# Patient Record
Sex: Male | Born: 1990 | Race: Black or African American | Hispanic: No | Marital: Single | Smoking: Current every day smoker
Health system: Southern US, Community
[De-identification: ages and names within clinical notes are randomized; demographics above are authoritative.]

---
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
## 2011-11-11 ENCOUNTER — Emergency Department (INDEPENDENT_AMBULATORY_CARE_PROVIDER_SITE_OTHER): Payer: Self-pay

## 2011-11-11 ENCOUNTER — Emergency Department (HOSPITAL_BASED_OUTPATIENT_CLINIC_OR_DEPARTMENT_OTHER)
Admission: EM | Admit: 2011-11-11 | Discharge: 2011-11-11 | Disposition: A | Discharge status: Home / Self Care | Payer: Self-pay | Attending: Emergency Medicine | Admitting: Emergency Medicine

## 2011-11-11 ENCOUNTER — Other Ambulatory Visit: Payer: Self-pay

## 2011-11-11 ENCOUNTER — Encounter: Payer: Self-pay | Admitting: *Deleted

## 2011-11-11 DIAGNOSIS — J45909 Unspecified asthma, uncomplicated: Secondary | ICD-10-CM | POA: Insufficient documentation

## 2011-11-11 DIAGNOSIS — R0989 Other specified symptoms and signs involving the circulatory and respiratory systems: Secondary | ICD-10-CM

## 2011-11-11 DIAGNOSIS — R0602 Shortness of breath: Secondary | ICD-10-CM

## 2011-11-11 DIAGNOSIS — R0789 Other chest pain: Secondary | ICD-10-CM | POA: Insufficient documentation

## 2011-11-11 NOTE — ED Notes (Signed)
Patient states that around 11am today he started experiencing SOB, L side stabbing chest pain, that  were intermitent, given one nitro and four baby aspirin during transport, no cardiac Hx

## 2011-11-11 NOTE — ED Provider Notes (Signed)
History     CSN: [id number] Arrival date & time: 11/11/2011 11:52 AM   First MD Initiated Contact with Patient 11/11/11 1230      Chief Complaint  Patient presents with  . Chest Pain    (Consider location/radiation/quality/duration/timing/severity/associated sxs/prior treatment) HPI Comments: Patient was at home, started with sudden onset of severe, sharp pain in the left anterior aspect of the chest.  He was not sob, coughing.  No other symptoms.  He is other wise healthy, exercises daily and has never had anything like this before.  Patient is a 20 y.o. male presenting with chest pain. The history is provided by the patient.  Chest Pain The chest pain began 1 - 2 hours ago. Chest pain occurs constantly. The chest pain is resolved. Associated with: nothing. At its most intense, the pain is at 10/10. The pain is currently at 0/10. The severity of the pain is severe. The quality of the pain is described as sharp. The pain does not radiate. Pertinent negatives for primary symptoms include no fever, no cough, no wheezing and no palpitations. He tried nothing for the symptoms. Risk factors include no known risk factors.     Past Medical History  Diagnosis Date  . Asthma     History reviewed. No pertinent past surgical history.  No family history on file.  History  Substance Use Topics  . Smoking status: Current Everyday Smoker  . Smokeless tobacco: Not on file  . Alcohol Use: No      Review of Systems  Constitutional: Negative for fever.  Respiratory: Negative for cough and wheezing.   Cardiovascular: Positive for chest pain. Negative for palpitations.  All other systems reviewed and are negative.    Allergies  Review of patient's allergies indicates no known allergies.  Home Medications   Current Outpatient Rx  Name Route Sig Dispense Refill  . ALBUTEROL IN Inhalation Inhale into the lungs as needed.        BP 110/52  Pulse 66  Temp 98.4 F (36.9 C)  Resp  19  SpO2 96%  Physical Exam  Nursing note and vitals reviewed. Constitutional: He is oriented to person, place, and time. He appears well-developed and well-nourished.  HENT:  Head: Normocephalic and atraumatic.  Neck: Normal range of motion. Neck supple.  Cardiovascular: Normal rate and regular rhythm.  Exam reveals no gallop and no friction rub.   No murmur heard. Pulmonary/Chest: Effort normal and breath sounds normal. No respiratory distress. He has no wheezes.  Abdominal: Soft. Bowel sounds are normal. He exhibits no distension. There is no tenderness.  Musculoskeletal: Normal range of motion. He exhibits no edema.  Neurological: He is alert and oriented to person, place, and time.  Skin: Skin is warm and dry.    ED Course  Procedures (including critical care time)  Labs Reviewed - No data to display No results found.   No diagnosis found.   Date: 11/11/2011  Rate: 64  Rhythm: normal sinus rhythm  QRS Axis: normal  Intervals: normal  ST/T Wave abnormalities: normal  Conduction Disutrbances:none  Narrative Interpretation:   Old EKG Reviewed: none available    MDM  Chest xray and ekg are both normal.  I suspect some sort of musculoskeletal etiology for the patient's symptoms which have since resolved.  I believe that patient is stable for discharge.  To return for any problems.          Geoffery Lyons, MD 11/11/11 1400

## 2011-11-11 NOTE — ED Notes (Signed)
Patient placed on 2 L oxygen  [place] per protocol

## 2011-11-11 NOTE — ED Notes (Signed)
Patient placed on five lead for monitoring, no c/o chest pain at this time, c/o HA

## 2012-12-19 ENCOUNTER — Emergency Department (HOSPITAL_COMMUNITY)
Admission: EM | Admit: 2012-12-19 | Discharge: 2012-12-19 | Disposition: A | Discharge status: Home / Self Care | Payer: Self-pay | Attending: Emergency Medicine | Admitting: Emergency Medicine

## 2012-12-19 ENCOUNTER — Encounter (HOSPITAL_COMMUNITY): Payer: Self-pay | Admitting: *Deleted

## 2012-12-19 DIAGNOSIS — L259 Unspecified contact dermatitis, unspecified cause: Secondary | ICD-10-CM | POA: Insufficient documentation

## 2012-12-19 DIAGNOSIS — F17200 Nicotine dependence, unspecified, uncomplicated: Secondary | ICD-10-CM | POA: Insufficient documentation

## 2012-12-19 DIAGNOSIS — Z79899 Other long term (current) drug therapy: Secondary | ICD-10-CM | POA: Insufficient documentation

## 2012-12-19 DIAGNOSIS — J45909 Unspecified asthma, uncomplicated: Secondary | ICD-10-CM | POA: Insufficient documentation

## 2012-12-19 MED ORDER — HYDROCORTISONE 1 % EX CREA: TOPICAL_CREAM | Freq: Two times a day (BID) | CUTANEOUS | Status: DC

## 2012-12-19 NOTE — ED Provider Notes (Signed)
History  This chart was scribed for non-physician practitioner working with Celene Kras, MD by Ardeen Jourdain, ED Scribe. This patient was seen in room TR05C/TR05C and the patient's care was started at 1545. .  CSN: [id number]  Arrival date & time 12/19/12  1521   First MD Initiated Contact with Patient 12/19/12 1545      Chief Complaint  Patient presents with  . Rash     The history is provided by the patient. No language interpreter was used.    Brad Wiggins is a 22 y.o. male who presents to the Emergency Department complaining of a rash to his left arm that has spread to his left abdomen with associated itching. He states the rash began this morning. He denies any pain with the rash, fever, nausea, emesis and diarrhea as associated symptoms. He admits to possible contact with a new soap but no other new substances or places that he has come into contact with.    Past Medical History  Diagnosis Date  . Asthma     History reviewed. No pertinent past surgical history.  History reviewed. No pertinent family history.  History  Substance Use Topics  . Smoking status: Current Every Day Smoker  . Smokeless tobacco: Not on file  . Alcohol Use: No      Review of Systems  Constitutional: Negative for fever and chills.  Gastrointestinal: Negative for nausea, vomiting and diarrhea.  Skin: Positive for rash.  All other systems reviewed and are negative.    Allergies  Review of patient's allergies indicates no known allergies.  Home Medications   Current Outpatient Rx  Name  Route  Sig  Dispense  Refill  . ALBUTEROL IN   Inhalation   Inhale into the lungs as needed.             Triage Vitals: BP 115/62  Pulse 62  Temp 98.3 F (36.8 C) (Oral)  Resp 16  SpO2 98%  Physical Exam  Nursing note and vitals reviewed. Constitutional: He is oriented to person, place, and time. He appears well-developed and well-nourished. No distress.  HENT:  Head: Normocephalic  and atraumatic.  Eyes: EOM are normal. Pupils are equal, round, and reactive to light.  Neck: Normal range of motion. Neck supple. No tracheal deviation present.  Cardiovascular: Normal rate.   Pulmonary/Chest: Effort normal. No respiratory distress.  Abdominal: Soft. He exhibits no distension.  Musculoskeletal: Normal range of motion. He exhibits no edema.  Neurological: He is alert and oriented to person, place, and time.  Skin: Skin is warm and dry. Rash noted.       3 by 2 cm papule on left lower abdomen, several small 1 by 1 cm papules on upper left arm   Psychiatric: He has a normal mood and affect. His behavior is normal.    ED Course  Procedures (including critical care time)  DIAGNOSTIC STUDIES: Oxygen Saturation is 98% on room air, normal by my interpretation.    COORDINATION OF CARE:  3:46 PM: Discussed treatment plan which includes steroid cream and recommendation with pt at bedside and pt agreed to plan.    Labs Reviewed - No data to display No results found.   1. Contact dermatitis       MDM  Will treat with hydrocortisone. Patient is stable and ready for discharge. I personally performed the services described in this documentation, which was scribed in my presence. The recorded information has been reviewed and is accurate.  Roxy Horseman, PA-C 12/20/12 [phone]

## 2012-12-19 NOTE — ED Notes (Addendum)
Reports recently changing to a different soap. Several raised, red areas on left upper arm. Also reports he has similar places on his truck and back. C/o itching, denies other symptoms.

## 2012-12-19 NOTE — ED Notes (Signed)
Pt reports having rash that started last night left arm, has spread to trunk area left side, reports itching.

## 2012-12-20 NOTE — ED Provider Notes (Signed)
Medical screening examination/treatment/procedure(s) were performed by non-physician practitioner and as supervising physician I was immediately available for consultation/collaboration.    Secilia Apps R Carisa Backhaus, MD 12/20/12 0031 

## 2012-12-25 ENCOUNTER — Emergency Department (HOSPITAL_COMMUNITY)
Admission: EM | Admit: 2012-12-25 | Discharge: 2012-12-25 | Disposition: A | Discharge status: Home / Self Care | Payer: Self-pay | Attending: Emergency Medicine | Admitting: Emergency Medicine

## 2012-12-25 ENCOUNTER — Encounter (HOSPITAL_COMMUNITY): Payer: Self-pay | Admitting: Cardiology

## 2012-12-25 DIAGNOSIS — L239 Allergic contact dermatitis, unspecified cause: Secondary | ICD-10-CM

## 2012-12-25 DIAGNOSIS — L259 Unspecified contact dermatitis, unspecified cause: Secondary | ICD-10-CM | POA: Insufficient documentation

## 2012-12-25 DIAGNOSIS — F17200 Nicotine dependence, unspecified, uncomplicated: Secondary | ICD-10-CM | POA: Insufficient documentation

## 2012-12-25 DIAGNOSIS — J45909 Unspecified asthma, uncomplicated: Secondary | ICD-10-CM | POA: Insufficient documentation

## 2012-12-25 MED ORDER — DIPHENHYDRAMINE HCL 25 MG PO TABS: 25.0000 mg | ORAL_TABLET | Freq: Four times a day (QID) | ORAL | Status: DC

## 2012-12-25 MED ORDER — TRIAMCINOLONE ACETONIDE 0.1 % EX CREA: TOPICAL_CREAM | Freq: Two times a day (BID) | CUTANEOUS | Status: DC

## 2012-12-25 NOTE — ED Provider Notes (Signed)
History  This chart was scribed for non-physician practitioner working with Carleene Cooper III, MD by Ardeen Jourdain, ED Scribe. This patient was seen in room TR05C/TR05C and the patient's care was started at 1656.  CSN: [id number]  Arrival date & time 12/25/12  1633   First MD Initiated Contact with Patient 12/25/12 1656      Chief Complaint  Patient presents with  . Rash     The history is provided by the patient. No language interpreter was used.    Brad Wiggins is a 22 y.o. male who presents to the Emergency Department complaining of a rash to his chest that is spreading to his right side with associated itching. He state he was seen last week for a similar rash on his left arm that was relieved by the hydrocortisone cream prescribed. He states the cream has not relieved the current rash. He denies coming into contact with any new substances or foods. He denies any pain to the area, difficulty breathing, difficulty swallowing, fever and chills as associated symptoms.    Past Medical History  Diagnosis Date  . Asthma     History reviewed. No pertinent past surgical history.  History reviewed. No pertinent family history.  History  Substance Use Topics  . Smoking status: Current Every Day Smoker  . Smokeless tobacco: Not on file  . Alcohol Use: No      Review of Systems  Constitutional: Negative for fever and chills.  Respiratory: Negative for chest tightness, shortness of breath and wheezing.   Gastrointestinal: Negative for nausea and vomiting.  Skin: Positive for rash.    Allergies  Review of patient's allergies indicates no known allergies.  Home Medications   Current Outpatient Rx  Name  Route  Sig  Dispense  Refill  . HYDROCORTISONE 1 % EX CREA   Topical   Apply topically 2 (two) times daily.   30 g   2     Triage Vitals: BP 98/69  Pulse 59  Temp 98.1 F (36.7 C) (Oral)  Resp 18  SpO2 99%  Physical Exam  Nursing note and vitals  reviewed. Constitutional: He is oriented to person, place, and time. He appears well-developed and well-nourished. No distress.  HENT:  Head: Normocephalic and atraumatic.       No oral lesions   Eyes: EOM are normal. Pupils are equal, round, and reactive to light.  Neck: Normal range of motion. Neck supple. No tracheal deviation present.  Cardiovascular: Normal rate, regular rhythm and normal heart sounds.   Pulmonary/Chest: Effort normal and breath sounds normal. No respiratory distress.  Abdominal: Soft. Bowel sounds are normal. He exhibits no distension. There is no tenderness.  Musculoskeletal: Normal range of motion. He exhibits no edema.  Neurological: He is alert and oriented to person, place, and time.  Skin: Skin is warm and dry. Rash noted.       Urticarious rash to left pectoral that is spreading to the right side of his chest, non-tender, skins intact, not warm to touch  Psychiatric: He has a normal mood and affect. His behavior is normal.    ED Course  Procedures (including critical care time)  DIAGNOSTIC STUDIES: Oxygen Saturation is 99% on room air, normal by my interpretation.    COORDINATION OF CARE:  5:00 PM: Discussed treatment plan which includes a steroid cream with pt at bedside and pt agreed to plan.     Labs Reviewed - No data to display No results found.  1. Allergic dermatitis       MDM  22 y/o male with hives/allergic dermatitis. Rx triamcinolone cream and benadryl. Advised cool compresses. No respiratory or airway compromise. Return precautions discussed. Patient states understanding of plan and is agreeable.     I personally performed the services described in this documentation, which was scribed in my presence. The recorded information has been reviewed and is accurate.    Trevor Mace, PA-C 12/25/12 [phone]

## 2012-12-25 NOTE — ED Notes (Signed)
Pt reports he was seen here previously with a rash on his arm. States the area has cleared up but is now having a rash on his neck. Pt states the area is itching.

## 2012-12-26 NOTE — ED Provider Notes (Signed)
Medical screening examination/treatment/procedure(s) were performed by non-physician practitioner and as supervising physician I was immediately available for consultation/collaboration.   Carleene Cooper III, MD 12/26/12 [phone]

## 2013-09-23 ENCOUNTER — Encounter (HOSPITAL_COMMUNITY): Payer: Self-pay | Admitting: Emergency Medicine

## 2013-09-23 ENCOUNTER — Emergency Department (HOSPITAL_COMMUNITY)
Admission: EM | Admit: 2013-09-23 | Discharge: 2013-09-23 | Disposition: A | Discharge status: Home / Self Care | Payer: Self-pay | Attending: Emergency Medicine | Admitting: Emergency Medicine

## 2013-09-23 ENCOUNTER — Emergency Department (HOSPITAL_COMMUNITY): Payer: Self-pay

## 2013-09-23 DIAGNOSIS — N453 Epididymo-orchitis: Secondary | ICD-10-CM | POA: Insufficient documentation

## 2013-09-23 DIAGNOSIS — J45909 Unspecified asthma, uncomplicated: Secondary | ICD-10-CM | POA: Insufficient documentation

## 2013-09-23 DIAGNOSIS — Z792 Long term (current) use of antibiotics: Secondary | ICD-10-CM | POA: Insufficient documentation

## 2013-09-23 DIAGNOSIS — N451 Epididymitis: Secondary | ICD-10-CM

## 2013-09-23 DIAGNOSIS — F17200 Nicotine dependence, unspecified, uncomplicated: Secondary | ICD-10-CM | POA: Insufficient documentation

## 2013-09-23 LAB — URINALYSIS, ROUTINE W REFLEX MICROSCOPIC
Bilirubin Urine: NEGATIVE
Ketones, ur: NEGATIVE mg/dL
Nitrite: NEGATIVE
Protein, ur: NEGATIVE mg/dL

## 2013-09-23 LAB — URINE MICROSCOPIC-ADD ON

## 2013-09-23 MED ORDER — CIPROFLOXACIN HCL 500 MG PO TABS: 500.0000 mg | ORAL_TABLET | Freq: Two times a day (BID) | ORAL | Status: DC

## 2013-09-23 MED ORDER — AZITHROMYCIN 250 MG PO TABS
1000.0000 mg | ORAL_TABLET | Freq: Once | ORAL | Status: AC
  Administered 2013-09-23: 1000 mg via ORAL
  Filled 2013-09-23: qty 4

## 2013-09-23 MED ORDER — CEFTRIAXONE SODIUM 1 G IJ SOLR
1.0000 g | Freq: Once | INTRAMUSCULAR | Status: AC
  Administered 2013-09-23: 1 g via INTRAMUSCULAR
  Filled 2013-09-23: qty 10

## 2013-09-23 MED ORDER — LIDOCAINE HCL (PF) 1 % IJ SOLN
INTRAMUSCULAR | Status: AC
  Administered 2013-09-23: 2.1 mL
  Filled 2013-09-23: qty 5

## 2013-09-23 MED ORDER — IBUPROFEN 600 MG PO TABS: 600.0000 mg | ORAL_TABLET | Freq: Two times a day (BID) | ORAL | Status: DC | PRN

## 2013-09-23 NOTE — ED Provider Notes (Signed)
CSN: [id number]     Arrival date & time 09/23/13  1208 History   First MD Initiated Contact with Patient 09/23/13 1213     Chief Complaint  Patient presents with  . Testicle Pain   (Consider location/radiation/quality/duration/timing/severity/associated sxs/prior Treatment) HPI Comments: Pain R testicle onset yesterday, worsening since this AM. Pain is aching in nature and nonradiating. Patient denies direct trauma or injury but states he was playing basketball prior to onset of symptoms. No scrotal redness or swelling, penile d/c or tenderness, dysuria, hematuria, fevers, N/V, and abdominal pain. Last BM was yesterday which was normal in color and consistency. Patient denies sexual activity; states he is not sexually active and has not been "for a while".  The history is provided by the patient. No language interpreter was used.    Past Medical History  Diagnosis Date  . Asthma    History reviewed. No pertinent past surgical history. History reviewed. No pertinent family history. History  Substance Use Topics  . Smoking status: Current Every Day Smoker  . Smokeless tobacco: Not on file  . Alcohol Use: No    Review of Systems  Constitutional: Negative for fever.  Gastrointestinal: Negative for nausea and vomiting.  Genitourinary: Positive for testicular pain. Negative for dysuria, hematuria, discharge, penile swelling, scrotal swelling and penile pain.  All other systems reviewed and are negative.    Allergies  Review of patient's allergies indicates no known allergies.  Home Medications   Current Outpatient Rx  Name  Route  Sig  Dispense  Refill  . ciprofloxacin (CIPRO) 500 MG tablet   Oral   Take 1 tablet (500 mg total) by mouth 2 (two) times daily.   28 tablet   0   . ibuprofen (ADVIL,MOTRIN) 600 MG tablet   Oral   Take 1 tablet (600 mg total) by mouth 2 (two) times daily as needed for pain.   20 tablet   0    BP 113/60  Pulse 57  Temp(Src) 98.4 F (36.9 C)  (Oral)  Resp 18  Ht 5\' 2"  (1.575 m)  Wt 122 lb 8 oz (55.566 kg)  BMI 22.4 kg/m2  SpO2 99%  Physical Exam  Nursing note and vitals reviewed. Constitutional: He is oriented to person, place, and time. He appears well-developed and well-nourished. No distress.  HENT:  Head: Normocephalic and atraumatic.  Eyes: Conjunctivae and EOM are normal. No scleral icterus.  Neck: Normal range of motion.  Cardiovascular: Normal rate, regular rhythm and intact distal pulses.   Pulmonary/Chest: Effort normal. No respiratory distress.  Abdominal: Soft. He exhibits no distension. There is no tenderness. Hernia confirmed negative in the right inguinal area and confirmed negative in the left inguinal area.  Genitourinary: Penis normal. Right testis shows mass and tenderness. Right testis shows no swelling. Right testis is descended. Left testis shows no mass, no swelling and no tenderness. Left testis is descended. Circumcised.  Chaperoned rectal exam with mild TTP of R testes and R epididymis. No other abnormalities appreciated.   Musculoskeletal: Normal range of motion.  Neurological: He is alert and oriented to person, place, and time.  Skin: Skin is warm and dry. No rash noted. He is not diaphoretic. No erythema. No pallor.  Psychiatric: He has a normal mood and affect. His behavior is normal.    ED Course  Procedures (including critical care time) Labs Review Labs Reviewed  URINALYSIS, ROUTINE W REFLEX MICROSCOPIC - Abnormal; Notable for the following:    Color, Urine STRAW (*)  Specific Gravity, Urine 1.003 (*)    Leukocytes, UA LARGE (*)    All other components within normal limits  GC/CHLAMYDIA PROBE AMP  URINE CULTURE  URINE MICROSCOPIC-ADD ON   Imaging Review US Scrotum  09/23/2013   CLINICAL DATA:  Testicular pain, right greater than left. No history of trauma. Question testicular torsion.  EXAM: SCROTAL ULTRASOUND  DOPPLER ULTRASOUND OF THE TESTICLES  TECHNIQUE: Complete ultrasound  examination of the testicles, epididymis, and other scrotal structures was performed. Color and spectral Doppler ultrasound were also utilized to evaluate blood flow to the testicles.  COMPARISON:  None.  FINDINGS: Right testis: 4.8 x 2.1 x 3.2 cm. Homogeneous parenchymal echotexture. No intratesticular mass lesion.  Left testis: 5.1 x 1.8 x 3.2 cm. Parenchymal echotexture is homogeneous without evidence for testicular mass.  Right epididymis:  Normal in size and appearance.  Left epididymis:  Normal in size and appearance.  Hydrocele:  present/absent  Varicocele: The patient has prominent venous plexus anatomy in each hemiscrotum, but the vascular channels do not reach 3 mm in diameter, even with Valsalva maneuver.  Pulsed Doppler interrogation of both testes demonstrates low resistance arterial and venous waveforms bilaterally.  IMPRESSION: No evidence for testicular torsion.  Prominent venous plexus in each hemiscrotum although no evidence for varicocele by size criteria.   Electronically Signed   By: Kennith Center M.D.   On: 09/23/2013 13:22   [place] Art/ven Flow Abd Pelv Doppler  09/23/2013   CLINICAL DATA:  Testicular pain, right greater than left. No history of trauma. Question testicular torsion.  EXAM: SCROTAL ULTRASOUND  DOPPLER ULTRASOUND OF THE TESTICLES  TECHNIQUE: Complete ultrasound examination of the testicles, epididymis, and other scrotal structures was performed. Color and spectral Doppler ultrasound were also utilized to evaluate blood flow to the testicles.  COMPARISON:  None.  FINDINGS: Right testis: 4.8 x 2.1 x 3.2 cm. Homogeneous parenchymal echotexture. No intratesticular mass lesion.  Left testis: 5.1 x 1.8 x 3.2 cm. Parenchymal echotexture is homogeneous without evidence for testicular mass.  Right epididymis:  Normal in size and appearance.  Left epididymis:  Normal in size and appearance.  Hydrocele:  present/absent  Varicocele: The patient has prominent venous plexus anatomy in each  hemiscrotum, but the vascular channels do not reach 3 mm in diameter, even with Valsalva maneuver.  Pulsed Doppler interrogation of both testes demonstrates low resistance arterial and venous waveforms bilaterally.  IMPRESSION: No evidence for testicular torsion.  Prominent venous plexus in each hemiscrotum although no evidence for varicocele by size criteria.   Electronically Signed   By: Kennith Center M.D.   On: 09/23/2013 13:22    MDM   1. Epididymitis    Patient presents for R testicular pain x 2 days. He is well and nontoxic appearing sleeping on initial and subsequent presentation to room over course of ED stay. No N/V, abdominal pain, urinary symptoms or hx of trauma. Patient hemodynamically stable and afebrile. Physical exam findings as above. Findings c/w epididymitis.  Labs and imaging reviewed; no evidence of testicular torsion on scrotal U/S. Pt treated in ER w 1g rocephin and 1 g azithromycin for epididymitis. Patient will be dc w Cipro 500 BID x 14 days. Pt denies a hx of renal issues or rectal pain. Will place on Motrin 600 BID x 10 days to help pain. Patient has been advised to rest, ice and scrotal support to help he is pain. Recommended purchasing jockstrap. Presentation non-concerning for testicular torsion or prostatitis. Patient is hemodynamically stable  and in no acute distress prior to discharge. Patient is agreeable to plan and will followup with urology if symptoms persist.    Antony Madura, PA-C 09/26/13 2139

## 2013-09-23 NOTE — ED Notes (Signed)
Pt c/o right sided groin and testicle pain x 2 days worse today; pt denies swelling or difficulty urinating

## 2013-09-24 LAB — URINE CULTURE: Culture: NO GROWTH

## 2013-10-01 NOTE — ED Provider Notes (Signed)
Medical screening examination/treatment/procedure(s) were performed by non-physician practitioner and as supervising physician I was immediately available for consultation/collaboration.   Roney Marion, MD 10/01/13 [phone]

## 2014-07-05 IMAGING — US US ART/VEN ABD/PELV/SCROTUM DOPPLER LTD
1 series · 13 of 25 positions shown · non-contrast
Comparison: None.

CLINICAL DATA: Testicular pain, right greater than left. No history
of trauma. Question testicular torsion.

EXAM:
SCROTAL ULTRASOUND
DOPPLER ULTRASOUND OF THE TESTICLES
TECHNIQUE: Complete ultrasound examination of the testicles, epididymis, and
other scrotal structures was performed. Color and spectral Doppler
ultrasound were also utilized to evaluate blood flow to the
testicles.

[Series 1: us art/ven abd/pelv/scrotum doppler ltd · 0.06mm/px · 13 of 48 slices shown]
[im 1/48]
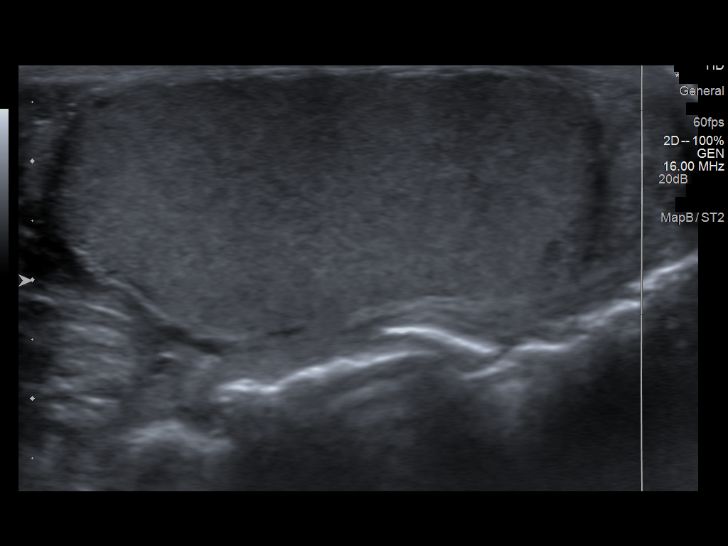
[im 4/48]
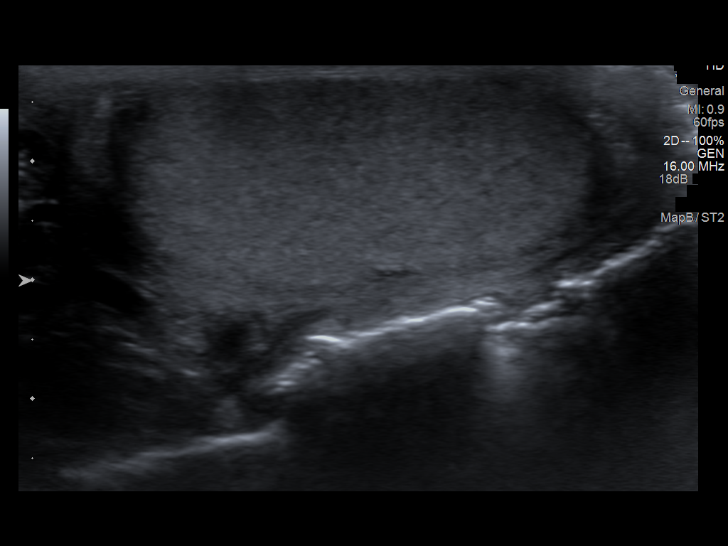
[im 8/48]
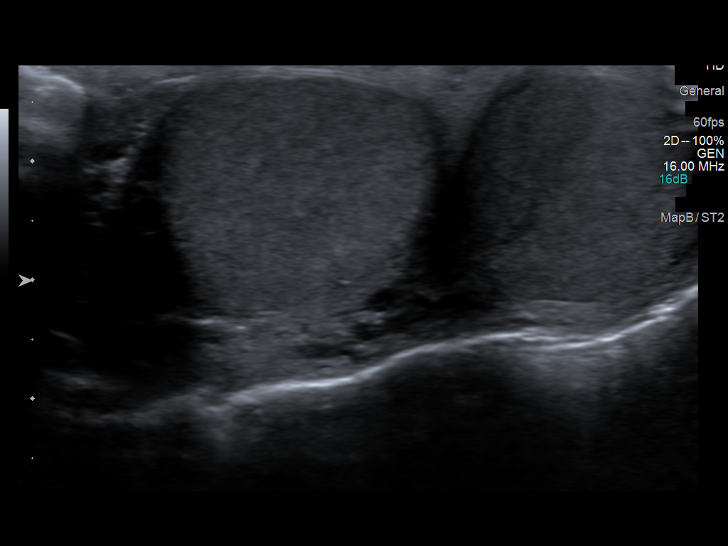
[im 12/48]
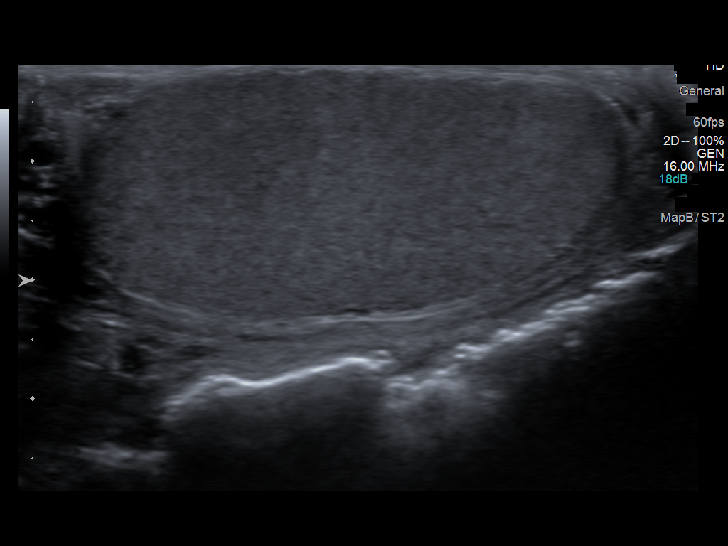
[im 16/48]
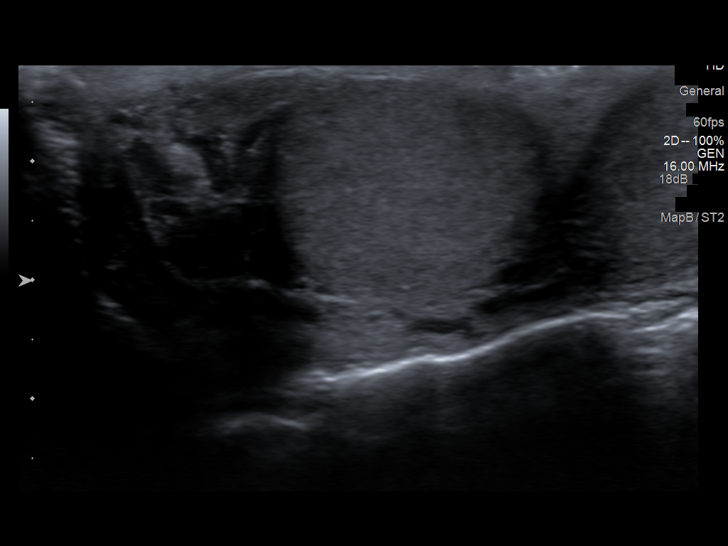
[im 20/48]
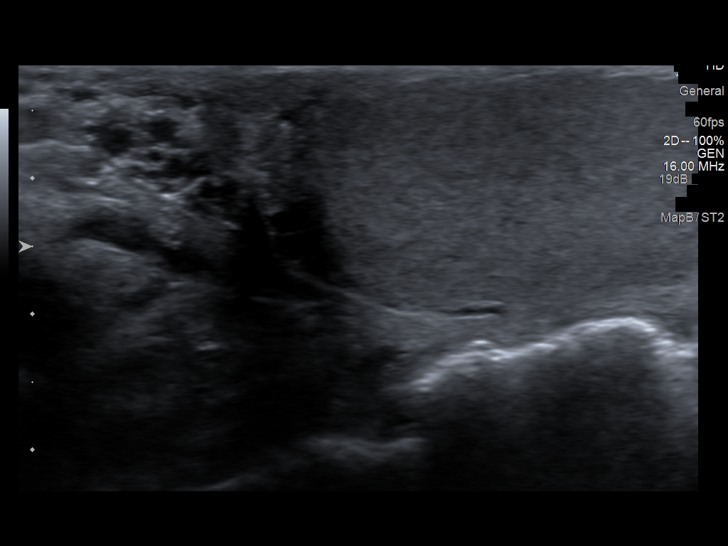
[im 24/48]
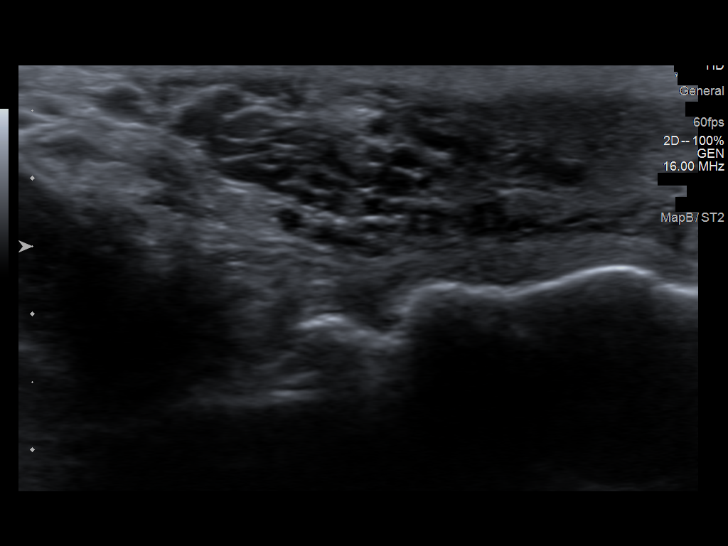
[im 28/48]
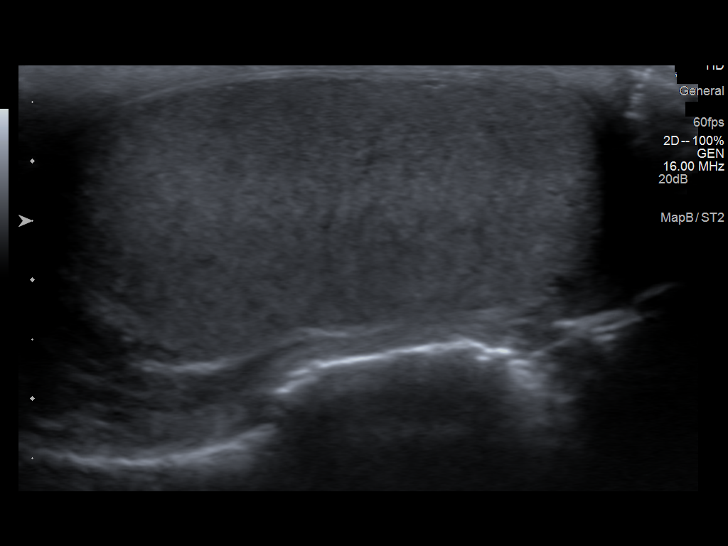
[im 32/48]
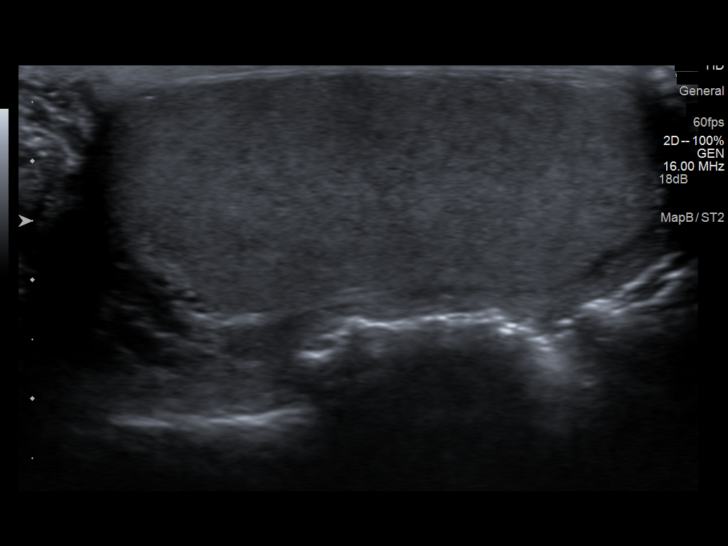
[im 36/48]
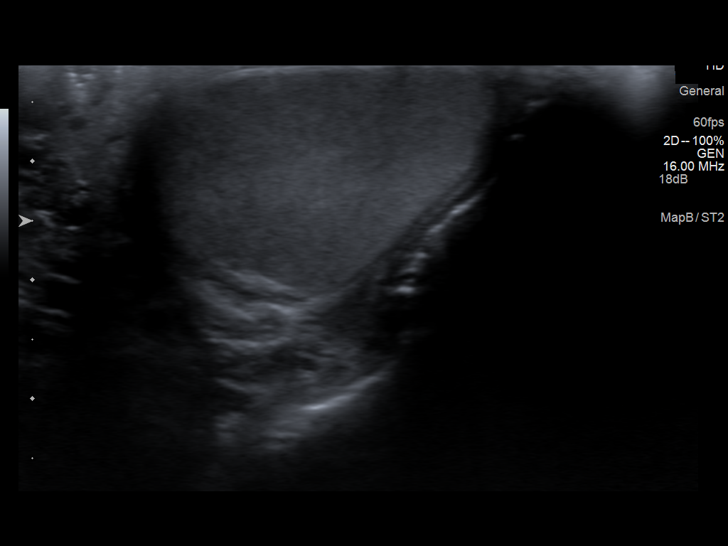
[im 40/48]
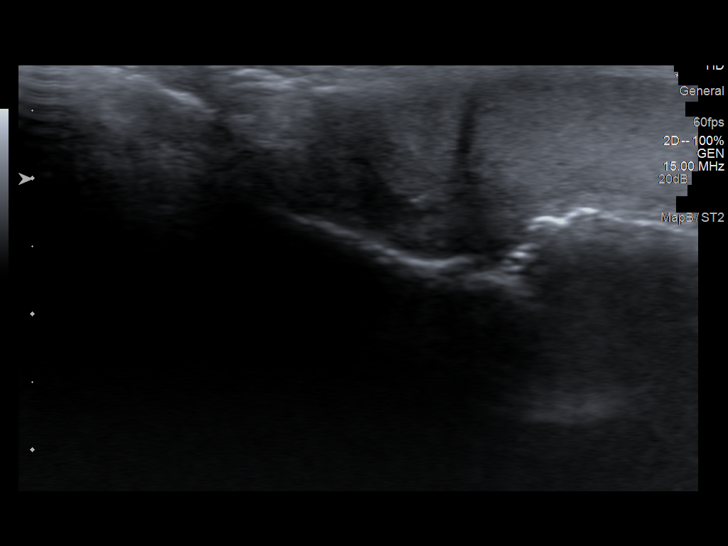
[im 44/48]
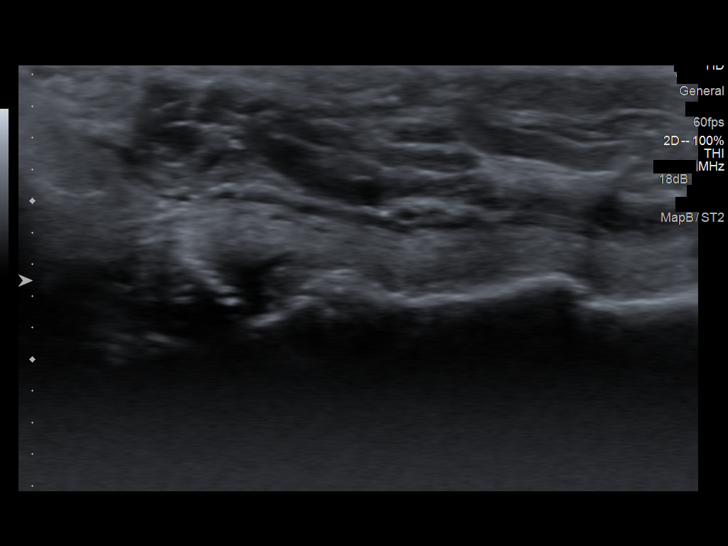
[im 48/48]
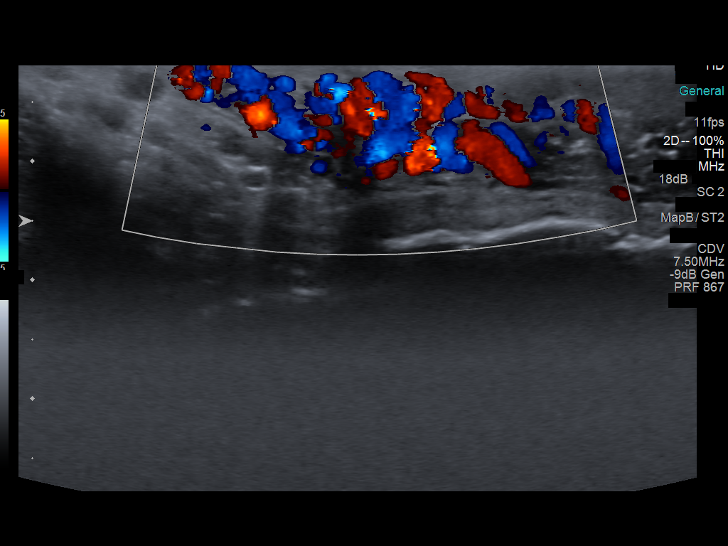

[13 of 25 positions shown; findings below may reference images not displayed]

FINDINGS: Right testis: 4.8 x 2.1 x 3.2 cm. Homogeneous parenchymal
echotexture. No intratesticular mass lesion.

Left testis: 5.1 x 1.8 x 3.2 cm. Parenchymal echotexture is
homogeneous without evidence for testicular mass.

Right epididymis:  Normal in size and appearance.

Left epididymis:  Normal in size and appearance.

Hydrocele:  present/absent

Varicocele: The patient has prominent venous plexus anatomy in each
hemiscrotum, but the vascular channels do not reach 3 mm in
diameter, even with Valsalva maneuver.

Pulsed Doppler interrogation of both testes demonstrates low
resistance arterial and venous waveforms bilaterally.
IMPRESSION: No evidence for testicular torsion.

Prominent venous plexus in each hemiscrotum although no evidence for
varicocele by size criteria.

## 2014-07-09 ENCOUNTER — Encounter (HOSPITAL_COMMUNITY): Payer: Self-pay | Admitting: Emergency Medicine

## 2014-07-09 ENCOUNTER — Emergency Department (HOSPITAL_COMMUNITY)
Admission: EM | Admit: 2014-07-09 | Discharge: 2014-07-09 | Disposition: A | Discharge status: Home / Self Care | Payer: Self-pay | Attending: Emergency Medicine | Admitting: Emergency Medicine

## 2014-07-09 DIAGNOSIS — R109 Unspecified abdominal pain: Secondary | ICD-10-CM | POA: Insufficient documentation

## 2014-07-09 DIAGNOSIS — M546 Pain in thoracic spine: Secondary | ICD-10-CM | POA: Insufficient documentation

## 2014-07-09 DIAGNOSIS — J45909 Unspecified asthma, uncomplicated: Secondary | ICD-10-CM | POA: Insufficient documentation

## 2014-07-09 DIAGNOSIS — F17200 Nicotine dependence, unspecified, uncomplicated: Secondary | ICD-10-CM | POA: Insufficient documentation

## 2014-07-09 LAB — URINE MICROSCOPIC-ADD ON

## 2014-07-09 LAB — I-STAT CHEM 8, ED
BUN: 6 mg/dL (ref 6–23)
CHLORIDE: 101 meq/L (ref 96–112)
Calcium, Ion: 1.24 mmol/L — ABNORMAL HIGH (ref 1.12–1.23)
Creatinine, Ser: 1 mg/dL (ref 0.50–1.35)
GLUCOSE: 90 mg/dL (ref 70–99)
HEMATOCRIT: 45 % (ref 39.0–52.0)
Hemoglobin: 15.3 g/dL (ref 13.0–17.0)
POTASSIUM: 4.6 meq/L (ref 3.7–5.3)
Sodium: 139 mEq/L (ref 137–147)
TCO2: 28 mmol/L (ref 0–100)

## 2014-07-09 LAB — URINALYSIS, ROUTINE W REFLEX MICROSCOPIC
BILIRUBIN URINE: NEGATIVE
Glucose, UA: NEGATIVE mg/dL
Hgb urine dipstick: NEGATIVE
Ketones, ur: NEGATIVE mg/dL
Leukocytes, UA: NEGATIVE
NITRITE: NEGATIVE
PH: 8 (ref 5.0–8.0)
Protein, ur: NEGATIVE mg/dL
SPECIFIC GRAVITY, URINE: 1.021 (ref 1.005–1.030)
Urobilinogen, UA: 1 mg/dL (ref 0.0–1.0)

## 2014-07-09 MED ORDER — NAPROXEN 375 MG PO TABS: 375.0000 mg | ORAL_TABLET | Freq: Two times a day (BID) | ORAL | Status: DC

## 2014-07-09 MED ORDER — TRAMADOL HCL 50 MG PO TABS
50.0000 mg | ORAL_TABLET | Freq: Once | ORAL | Status: AC
  Administered 2014-07-09: 50 mg via ORAL
  Filled 2014-07-09: qty 1

## 2014-07-09 MED ORDER — CYCLOBENZAPRINE HCL 10 MG PO TABS: 10.0000 mg | ORAL_TABLET | Freq: Two times a day (BID) | ORAL | Status: DC | PRN

## 2014-07-09 NOTE — Discharge Planning (Signed)
[facility] Community Liaison  Spoke to the patient regarding primary care resources and establishing care with a provider. Resource guide and my contact information provided for any future questions or concerns. No other community liaison needs identified at this time.

## 2014-07-09 NOTE — ED Provider Notes (Signed)
CSN: [id number]     Arrival date & time 07/09/14  1201 History   First MD Initiated Contact with Patient 07/09/14 1207     Chief Complaint  Patient presents with  . Flank Pain     (Consider location/radiation/quality/duration/timing/severity/associated sxs/prior Treatment) HPI  Patient presents to the emergency department with complaints of left flank pain that comes and goes intermittently. He reports yesterday he had pain for approximately 5 minutes that brought him to his knees and then resolved spontaneously. Today he felt the pain, and it was brief lasting only a couple of seconds this prompted him, and the pain evaluated. He denies having shortness of breath, coughing, chest pain, dysuria, penile discharge, penile pain, testicular swelling or pain, abdominal pains. Denies having history of kidney stones. Certain positions can illicit the pain. He has no pain at rest of when laying still.  Pt would like to get his kidneys evaluated.   Past Medical History  Diagnosis Date  . Asthma    History reviewed. No pertinent past surgical history. History reviewed. No pertinent family history. History  Substance Use Topics  . Smoking status: Current Every Day Smoker  . Smokeless tobacco: Not on file  . Alcohol Use: No    Review of Systems   Review of Systems  Gen: no weight loss, fevers, chills, night sweats  Eyes: no discharge or drainage, no occular pain or visual changes  Nose: no epistaxis or rhinorrhea  Mouth: no dental pain, no sore throat  Neck: no neck pain  Lungs:No wheezing, coughing or hemoptysis CV: no chest pain, palpitations, dependent edema or orthopnea  Abd: no abdominal pain, nausea, vomiting, diarrhea GU: no dysuria or gross hematuria  MSK:  No muscle weakness + left flank pain Neuro: no headache, no focal neurologic deficits  Skin: no rash or wounds Psyche: no complaints    Allergies  Review of patient's allergies indicates no known allergies.  Home  Medications   Prior to Admission medications   Medication Sig Start Date End Date Taking? Authorizing Provider  albuterol (PROVENTIL HFA;VENTOLIN HFA) 108 (90 BASE) MCG/ACT inhaler Inhale 2 puffs into the lungs every 6 (six) hours as needed for wheezing.   Yes Historical Provider, MD  ibuprofen (ADVIL,MOTRIN) 600 MG tablet Take 1 tablet (600 mg total) by mouth 2 (two) times daily as needed for pain. 09/23/13  Yes Antony MaduraKelly Humes, PA-C  cyclobenzaprine (FLEXERIL) 10 MG tablet Take 1 tablet (10 mg total) by mouth 2 (two) times daily as needed for muscle spasms. 07/09/14   Lona Six Irine SealG Laelynn Blizzard, PA-C  naproxen (NAPROSYN) 375 MG tablet Take 1 tablet (375 mg total) by mouth 2 (two) times daily. 07/09/14   Myking Sar Irine SealG Brayen Bunn, PA-C   BP 110/61  Pulse 64  Temp(Src) 97.7 F (36.5 C) (Oral)  Resp 18  Ht 5\' 2"  (1.575 m)  Wt 123 lb (55.792 kg)  BMI 22.49 kg/m2  SpO2 99% Physical Exam  Nursing note and vitals reviewed. Constitutional: He appears well-developed and well-nourished. No distress.  HENT:  Head: Normocephalic and atraumatic.  Eyes: Pupils are equal, round, and reactive to light.  Neck: Normal range of motion. Neck supple.  Cardiovascular: Normal rate and regular rhythm.   Pulmonary/Chest: Effort normal.  Abdominal: Soft.  Musculoskeletal:       Lumbar back: He exhibits tenderness, pain and spasm. He exhibits normal range of motion, no bony tenderness, no swelling, no edema, no deformity, no laceration and normal pulse.       Back:  Neurological:  He is alert.  Skin: Skin is warm and dry.      ED Course  Procedures (including critical care time) Labs Review Labs Reviewed  URINALYSIS, ROUTINE W REFLEX MICROSCOPIC - Abnormal; Notable for the following:    APPearance TURBID (*)    All other components within normal limits  URINE MICROSCOPIC-ADD ON - Abnormal; Notable for the following:    Squamous Epithelial / LPF FEW (*)    Bacteria, UA FEW (*)    All other components within normal limits   I-STAT CHEM 8, ED - Abnormal; Notable for the following:    Calcium, Ion 1.24 (*)    All other components within normal limits    Imaging Review No results found.   EKG Interpretation None      MDM   Final diagnoses:  Left-sided thoracic back pain    Patient has a normal i-stat chem 8 and urinalysis. His symptoms seemed muscular and he has no GU symptoms. He also has only had 1 episode < few seconds worth of pain today. No fevers, nausea, vomiting and diarrhea. Will treat as MSK pain and referral to Ortho. Given return to ED precautions.  Pt denied needing any medication in the ED as he was pain free.  cyclobenzaprine (FLEXERIL) 10 MG tablet Take 1 tablet (10 mg total) by mouth 2 (two) times daily as needed for muscle spasms. 20 tablet Dorthula Matas, PA-C naproxen (NAPROSYN) 375 MG tablet Take 1 tablet (375 mg total) by mouth 2 (two) times daily. 20 tablet Dorthula Matas, PA-C   22 y.o.Brad Wiggins's evaluation in the Emergency Department is complete. It has been determined that no acute conditions requiring further emergency intervention are present at this time. The patient/guardian have been advised of the diagnosis and plan. We have discussed signs and symptoms that warrant return to the ED, such as changes or worsening in symptoms.  Vital signs are stable at discharge. Filed Vitals:   07/09/14 1230  BP: 110/61  Pulse:   Temp:   Resp: 18    Patient/guardian has voiced understanding and agreed to follow-up with the PCP or specialist.     Dorthula Matas, PA-C 07/09/14 1512

## 2014-07-09 NOTE — ED Notes (Signed)
Pt is c/o of having pain in the lower left back.  Pt states he has not had a bowel movement since Sunday since his mother gave him Milk of Magnesia.  Pt states his stool was loose but has not had a one since.  Pt states he normally averages a BM everyday.  Pt denies pain during urination, no blood and foul smells.

## 2014-07-09 NOTE — ED Provider Notes (Signed)
  Medical screening examination/treatment/procedure(s) were performed by non-physician practitioner and as supervising physician I was immediately available for consultation/collaboration.   EKG Interpretation None         Gerhard Munchobert Zeph Riebel, MD 07/09/14 1514

## 2014-07-09 NOTE — ED Notes (Signed)
C/o pain to left flank "comes and goes" intense when has pain "like a muscle pain" denies any urinary complaints

## 2014-07-09 NOTE — Discharge Instructions (Signed)
Musculoskeletal Pain Musculoskeletal pain is muscle and boney aches and pains. These pains can occur in any part of the body. Your caregiver may treat you without knowing the cause of the pain. They may treat you if blood or urine tests, X-rays, and other tests were normal.  CAUSES There is often not a definite cause or reason for these pains. These pains may be caused by a type of germ (virus). The discomfort may also come from overuse. Overuse includes working out too hard when your body is not fit. Boney aches also come from weather changes. Bone is sensitive to atmospheric pressure changes. HOME CARE INSTRUCTIONS   Ask when your test results will be ready. Make sure you get your test results.  Only take over-the-counter or prescription medicines for pain, discomfort, or fever as directed by your caregiver. If you were given medications for your condition, do not drive, operate machinery or power tools, or sign legal documents for 24 hours. Do not drink alcohol. Do not take sleeping pills or other medications that may interfere with treatment.  Continue all activities unless the activities cause more pain. When the pain lessens, slowly resume normal activities. Gradually increase the intensity and duration of the activities or exercise.  During periods of severe pain, bed rest may be helpful. Lay or sit in any position that is comfortable.  Putting ice on the injured area.  Put ice in a bag.  Place a towel between your skin and the bag.  Leave the ice on for 15 to 20 minutes, 3 to 4 times a day.  Follow up with your caregiver for continued problems and no reason can be found for the pain. If the pain becomes worse or does not go away, it may be necessary to repeat tests or do additional testing. Your caregiver may need to look further for a possible cause. SEEK IMMEDIATE MEDICAL CARE IF:  You have pain that is getting worse and is not relieved by medications.  You develop chest pain  that is associated with shortness or breath, sweating, feeling sick to your stomach (nauseous), or throw up (vomit).  Your pain becomes localized to the abdomen.  You develop any new symptoms that seem different or that concern you. MAKE SURE YOU:   Understand these instructions.  Will watch your condition.  Will get help right away if you are not doing well or get worse. Document Released: 12/03/2005 Document Revised: 02/25/2012 Document Reviewed: 08/07/2013 [facility] Patient Information 2015 [place], [place]. This information is not intended to replace advice given to you by your health care provider. Make sure you discuss any questions you have with your health care provider.  Back Pain, Adult Low back pain is very common. About 1 in 5 people have back pain.The cause of low back pain is rarely dangerous. The pain often gets better over time.About half of people with a sudden onset of back pain feel better in just 2 weeks. About 8 in 10 people feel better by 6 weeks.  CAUSES Some common causes of back pain include:  Strain of the muscles or ligaments supporting the spine.  Wear and tear (degeneration) of the spinal discs.  Arthritis.  Direct injury to the back. DIAGNOSIS Most of the time, the direct cause of low back pain is not known.However, back pain can be treated effectively even when the exact cause of the pain is unknown.Answering your caregiver's questions about your overall health and symptoms is one of the most accurate ways to  make sure the cause of your pain is not dangerous. If your caregiver needs more information, he or she may order lab work or imaging tests (X-rays or MRIs).However, even if imaging tests show changes in your back, this usually does not require surgery. HOME CARE INSTRUCTIONS For many people, back pain returns.Since low back pain is rarely dangerous, it is often a condition that people can learn to [facility] their own.   Remain active. It is  stressful on the back to sit or stand in one place. Do not sit, drive, or stand in one place for more than 30 minutes at a time. Take short walks on level surfaces as soon as pain allows.Try to increase the length of time you walk each day.  Do not stay in bed.Resting more than 1 or 2 days can delay your recovery.  Do not avoid exercise or work.Your body is made to move.It is not dangerous to be active, even though your back may hurt.Your back will likely heal faster if you return to being active before your pain is gone.  Pay attention to your body when you bend and lift. Many people have less discomfortwhen lifting if they bend their knees, keep the load close to their bodies,and avoid twisting. Often, the most comfortable positions are those that put less stress on your recovering back.  Find a comfortable position to sleep. Use a firm mattress and lie on your side with your knees slightly bent. If you lie on your back, put a pillow under your knees.  Only take over-the-counter or prescription medicines as directed by your caregiver. Over-the-counter medicines to reduce pain and inflammation are often the most helpful.Your caregiver may prescribe muscle relaxant drugs.These medicines help dull your pain so you can more quickly return to your normal activities and healthy exercise.  Put ice on the injured area.  Put ice in a plastic bag.  Place a towel between your skin and the bag.  Leave the ice on for 15-20 minutes, 03-04 times a day for the first 2 to 3 days. After that, ice and heat may be alternated to reduce pain and spasms.  Ask your caregiver about trying back exercises and gentle massage. This may be of some benefit.  Avoid feeling anxious or stressed.Stress increases muscle tension and can worsen back pain.It is important to recognize when you are anxious or stressed and learn ways to manage it.Exercise is a great option. SEEK MEDICAL CARE IF:  You have pain that is  not relieved with rest or medicine.  You have pain that does not improve in 1 week.  You have new symptoms.  You are generally not feeling well. SEEK IMMEDIATE MEDICAL CARE IF:   You have pain that radiates from your back into your legs.  You develop new bowel or bladder control problems.  You have unusual weakness or numbness in your arms or legs.  You develop nausea or vomiting.  You develop abdominal pain.  You feel faint. Document Released: 12/03/2005 Document Revised: 06/03/2012 Document Reviewed: 04/06/2014 [facility] Patient Information 2015 [place]. This information is not intended to replace advice given to you by your health care provider. Make sure you discuss any questions you have with your health care provider.

## 2022-06-05 ENCOUNTER — Ambulatory Visit
Admission: EM | Admit: 2022-06-05 | Discharge: 2022-06-05 | Disposition: A | Discharge status: Home / Self Care | Payer: Self-pay | Attending: Family Medicine | Admitting: Family Medicine

## 2022-06-05 DIAGNOSIS — M545 Low back pain, unspecified: Secondary | ICD-10-CM

## 2022-06-05 MED ORDER — NAPROXEN 500 MG PO TABS: 500.0000 mg | ORAL_TABLET | Freq: Two times a day (BID) | ORAL | 0 refills | Status: AC

## 2022-06-05 MED ORDER — KETOROLAC TROMETHAMINE 60 MG/2ML IM SOLN
30.0000 mg | Freq: Once | INTRAMUSCULAR | Status: AC
  Administered 2022-06-05: 30 mg via INTRAMUSCULAR

## 2022-06-05 MED ORDER — TIZANIDINE HCL 4 MG PO TABS: 4.0000 mg | ORAL_TABLET | Freq: Three times a day (TID) | ORAL | 0 refills | Status: AC | PRN

## 2022-06-05 NOTE — ED Triage Notes (Signed)
Patient presents to Urgent Care with complaints of 10/10 low back pain since yesterday that started after throwing out some heavy trash. Patient reports motrin and tylenol with minimal Improvement.

## 2022-06-05 NOTE — ED Provider Notes (Signed)
[facility]    CSN: [id number] Arrival date & time: 06/05/22  0836      History   Chief Complaint Chief Complaint  Patient presents with   Back Pain    HPI Brad Wiggins is a 31 y.o. male.    Back Pain  Here for low back pain that began yesterday when he was throwing out a heavy bag of trash and swung his arms up to throat away.  He did not fall and has not had any blow or trauma to the back.  No fever or hematuria or dysuria.  No rash.  The pain is in the midline and does not radiate  Past Medical History:  Diagnosis Date   Asthma     There are no problems to display for this patient.   History reviewed. No pertinent surgical history.     Home Medications    Prior to Admission medications   Medication Sig Start Date End Date Taking? Authorizing Provider  naproxen (NAPROSYN) 500 MG tablet Take 1 tablet (500 mg total) by mouth 2 (two) times daily. 06/05/22  Yes Shaneese Tait, Janace Aris, MD  tiZANidine (ZANAFLEX) 4 MG tablet Take 1 tablet (4 mg total) by mouth every 8 (eight) hours as needed for muscle spasms. 06/05/22  Yes Kahlil Cowans, Janace Aris, MD  albuterol (PROVENTIL HFA;VENTOLIN HFA) 108 (90 BASE) MCG/ACT inhaler Inhale 2 puffs into the lungs every 6 (six) hours as needed for wheezing.    [provider]    Family History History reviewed. No pertinent family history.  Social History Social History   Tobacco Use   Smoking status: Every Day    Packs/day: 0.25    Types: Cigarettes  Substance Use Topics   Alcohol use: No   Drug use: No     Allergies   Patient has no known allergies.   Review of Systems Review of Systems  Musculoskeletal:  Positive for back pain.     Physical Exam Triage Vital Signs ED Triage Vitals  Enc Vitals Group     BP 06/05/22 0913 109/70     Pulse Rate 06/05/22 0913 71     Resp 06/05/22 0913 (!) 95     Temp 06/05/22 0913 97.7 F (36.5 C)     Temp src --      SpO2 06/05/22 0913 95 %     Weight --       Height --      Head Circumference --      Peak Flow --      Pain Score 06/05/22 0912 10     Pain Loc --      Pain Edu? --      Excl. in GC? --    No data found.  Updated Vital Signs BP 109/70   Pulse 71   Temp 97.7 F (36.5 C)   Resp (!) 95   SpO2 95%   Visual Acuity Right Eye Distance:   Left Eye Distance:   Bilateral Distance:    Right Eye Near:   Left Eye Near:    Bilateral Near:     Physical Exam Vitals reviewed.  Constitutional:      General: He is not in acute distress.    Appearance: He is not ill-appearing, toxic-appearing or diaphoretic.  Eyes:     Extraocular Movements: Extraocular movements intact.     Conjunctiva/sclera: Conjunctivae normal.     Pupils: Pupils are equal, round, and reactive to light.  Cardiovascular:  Rate and Rhythm: Normal rate and regular rhythm.     Heart sounds: No murmur heard. Pulmonary:     Effort: Pulmonary effort is normal.     Breath sounds: Normal breath sounds.  Musculoskeletal:     Cervical back: Neck supple.     Comments: There is tenderness of the midline in the lower lumbar area and along the paraspinous muscles  Lymphadenopathy:     Cervical: No cervical adenopathy.  Skin:    Capillary Refill: Capillary refill takes less than 2 seconds.     Findings: No rash.  Neurological:     General: No focal deficit present.     Mental Status: He is oriented to person, place, and time.  Psychiatric:        Behavior: Behavior normal.      UC Treatments / Results  Labs (all labs ordered are listed, but only abnormal results are displayed) Labs Reviewed - No data to display  EKG   Radiology No results found.  Procedures Procedures (including critical care time)  Medications Ordered in UC Medications  ketorolac (TORADOL) injection 30 mg (has no administration in time range)    Initial Impression / Assessment and Plan / UC Course  I have reviewed the triage vital signs and the nursing  notes.  Pertinent labs & imaging results that were available during my care of the patient were reviewed by me and considered in my medical decision making (see chart for details).     We will treat for pain with Toradol, naproxen, and tizanidine Final Clinical Impressions(s) / UC Diagnoses   Final diagnoses:  Acute midline low back pain without sciatica     Discharge Instructions      You have been given a shot of Toradol 30 mg today.  Naproxen 500 mg--1 tablet 2 times daily as needed for pain  Tizanidine 4 mg--1 tablet every 8 hours as needed for muscle pain or muscle spasms.  This medication can make you sleepy or dizzy     ED Prescriptions     Medication Sig Dispense Auth. Provider   naproxen (NAPROSYN) 500 MG tablet Take 1 tablet (500 mg total) by mouth 2 (two) times daily. 30 tablet Isis Costanza, Janace Aris, MD   tiZANidine (ZANAFLEX) 4 MG tablet Take 1 tablet (4 mg total) by mouth every 8 (eight) hours as needed for muscle spasms. 30 tablet Nelda Luckey, Janace Aris, MD      I have reviewed the PDMP during this encounter.   Zenia Resides, MD 06/05/22 [phone]

## 2022-06-05 NOTE — Discharge Instructions (Addendum)
You have been given a shot of Toradol 30 mg today.  Naproxen 500 mg--1 tablet 2 times daily as needed for pain  Tizanidine 4 mg--1 tablet every 8 hours as needed for muscle pain or muscle spasms.  This medication can make you sleepy or dizzy

## 2022-07-19 ENCOUNTER — Emergency Department (HOSPITAL_COMMUNITY)
Admission: EM | Admit: 2022-07-19 | Discharge: 2022-07-19 | Discharge status: Against Medical Advice | Payer: Self-pay | Attending: Student | Admitting: Student

## 2022-07-19 ENCOUNTER — Encounter (HOSPITAL_COMMUNITY): Payer: Self-pay | Admitting: Emergency Medicine

## 2022-07-19 ENCOUNTER — Emergency Department (HOSPITAL_COMMUNITY): Payer: Self-pay

## 2022-07-19 DIAGNOSIS — J029 Acute pharyngitis, unspecified: Secondary | ICD-10-CM | POA: Insufficient documentation

## 2022-07-19 DIAGNOSIS — R059 Cough, unspecified: Secondary | ICD-10-CM | POA: Insufficient documentation

## 2022-07-19 DIAGNOSIS — Z5321 Procedure and treatment not carried out due to patient leaving prior to being seen by health care provider: Secondary | ICD-10-CM | POA: Insufficient documentation

## 2022-07-19 DIAGNOSIS — M791 Myalgia, unspecified site: Secondary | ICD-10-CM | POA: Insufficient documentation

## 2022-07-19 DIAGNOSIS — Z20822 Contact with and (suspected) exposure to covid-19: Secondary | ICD-10-CM | POA: Insufficient documentation

## 2022-07-19 LAB — RESP PANEL BY RT-PCR (FLU A&B, COVID) ARPGX2
Influenza A by PCR: NEGATIVE
Influenza B by PCR: NEGATIVE
SARS Coronavirus 2 by RT PCR: NEGATIVE

## 2022-07-19 NOTE — ED Triage Notes (Signed)
Patient with cough, sore throat and body aches for 2 days. Denies SHOB, chest pain, fevers, chills or aches. 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

## 2022-07-19 NOTE — ED Provider Triage Note (Signed)
Emergency Medicine Provider Triage Evaluation Note  Brad Wiggins , a 31 y.o. male  was evaluated in triage.  Pt complains of, sore throat and body aches for 2 days.  Symptoms worse today.  Unsure of fevers.  Denies chest pain or shortness of breath, no vomiting or abdominal pain.  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  Review of Systems  Positive: Cough, congestion, sore throat, body aches Negative: Fever, chest pain, shortness of breath, vomiting, abdominal pain  Physical Exam  BP 122/88   Pulse 67   Temp 97.8 F (36.6 C) (Oral)   Resp 16   SpO2 100%  Gen:   Awake, no distress   Resp:  Normal effort, lungs clear to auscultation bilaterally MSK:   Moves extremities without difficulty  Other:  Posterior oropharynx erythematous without exudates  Medical Decision Making  Medically screening exam initiated at 12:47 PM.  Appropriate orders placed.  Brad Wiggins was informed that the remainder of the evaluation will be completed by another provider, this initial triage assessment does not replace that evaluation, and the importance of remaining in the ED until their evaluation is complete.  Chest x-ray and COVID/flu swab ordered   Brad Lodge, PA-C 07/19/22 1256

## 2022-07-19 NOTE — ED Notes (Signed)
Pt no answer for room.
